# Patient Record
Sex: Male | Born: 1975 | Race: Black or African American | Hispanic: No | Marital: Married | Smoking: Never smoker
Health system: Southern US, Community
[De-identification: ages and names within clinical notes are randomized; demographics above are authoritative.]

---

## 2014-02-12 ENCOUNTER — Emergency Department (HOSPITAL_BASED_OUTPATIENT_CLINIC_OR_DEPARTMENT_OTHER)
Admission: EM | Admit: 2014-02-12 | Discharge: 2014-02-12 | Disposition: A | Discharge status: Home / Self Care | Payer: Managed Care, Other (non HMO) | Attending: Emergency Medicine | Admitting: Emergency Medicine

## 2014-02-12 ENCOUNTER — Emergency Department (HOSPITAL_BASED_OUTPATIENT_CLINIC_OR_DEPARTMENT_OTHER): Payer: Managed Care, Other (non HMO)

## 2014-02-12 ENCOUNTER — Encounter (HOSPITAL_BASED_OUTPATIENT_CLINIC_OR_DEPARTMENT_OTHER): Payer: Self-pay | Admitting: Emergency Medicine

## 2014-02-12 DIAGNOSIS — N289 Disorder of kidney and ureter, unspecified: Secondary | ICD-10-CM | POA: Insufficient documentation

## 2014-02-12 DIAGNOSIS — N200 Calculus of kidney: Secondary | ICD-10-CM | POA: Insufficient documentation

## 2014-02-12 LAB — URINALYSIS, ROUTINE W REFLEX MICROSCOPIC
Bilirubin Urine: NEGATIVE
GLUCOSE, UA: NEGATIVE mg/dL
HGB URINE DIPSTICK: NEGATIVE
Ketones, ur: NEGATIVE mg/dL
Leukocytes, UA: NEGATIVE
Nitrite: NEGATIVE
PH: 5.5 (ref 5.0–8.0)
Protein, ur: NEGATIVE mg/dL
SPECIFIC GRAVITY, URINE: 1.031 — AB (ref 1.005–1.030)
Urobilinogen, UA: 1 mg/dL (ref 0.0–1.0)

## 2014-02-12 LAB — CBC WITH DIFFERENTIAL/PLATELET
Basophils Absolute: 0 10*3/uL (ref 0.0–0.1)
Basophils Relative: 0 % (ref 0–1)
EOS PCT: 6 % — AB (ref 0–5)
Eosinophils Absolute: 0.3 10*3/uL (ref 0.0–0.7)
HEMATOCRIT: 38.5 % — AB (ref 39.0–52.0)
Hemoglobin: 13.7 g/dL (ref 13.0–17.0)
LYMPHS PCT: 59 % — AB (ref 12–46)
Lymphs Abs: 3.5 10*3/uL (ref 0.7–4.0)
MCH: 32.2 pg (ref 26.0–34.0)
MCHC: 35.6 g/dL (ref 30.0–36.0)
MCV: 90.4 fL (ref 78.0–100.0)
MONO ABS: 0.4 10*3/uL (ref 0.1–1.0)
Monocytes Relative: 6 % (ref 3–12)
NEUTROS ABS: 1.8 10*3/uL (ref 1.7–7.7)
Neutrophils Relative %: 29 % — ABNORMAL LOW (ref 43–77)
Platelets: UNDETERMINED 10*3/uL (ref 150–400)
RBC: 4.26 MIL/uL (ref 4.22–5.81)
RDW: 13.2 % (ref 11.5–15.5)
WBC: 6 10*3/uL (ref 4.0–10.5)

## 2014-02-12 LAB — BASIC METABOLIC PANEL
BUN: 20 mg/dL (ref 6–23)
CHLORIDE: 104 meq/L (ref 96–112)
CO2: 24 mEq/L (ref 19–32)
Calcium: 9.6 mg/dL (ref 8.4–10.5)
Creatinine, Ser: 1.8 mg/dL — ABNORMAL HIGH (ref 0.50–1.35)
GFR calc non Af Amer: 46 mL/min — ABNORMAL LOW (ref >90)
GFR, EST AFRICAN AMERICAN: 54 mL/min — AB (ref >90)
Glucose, Bld: 121 mg/dL — ABNORMAL HIGH (ref 70–99)
Potassium: 3.6 mEq/L — ABNORMAL LOW (ref 3.7–5.3)
Sodium: 142 mEq/L (ref 137–147)

## 2014-02-12 MED ORDER — MORPHINE SULFATE 4 MG/ML IJ SOLN
INTRAMUSCULAR | Status: AC
  Filled 2014-02-12: qty 1

## 2014-02-12 MED ORDER — KETOROLAC TROMETHAMINE 30 MG/ML IJ SOLN
INTRAMUSCULAR | Status: AC
  Filled 2014-02-12: qty 1

## 2014-02-12 MED ORDER — KETOROLAC TROMETHAMINE 30 MG/ML IJ SOLN
30.0000 mg | Freq: Once | INTRAMUSCULAR | Status: AC
  Administered 2014-02-12: 30 mg via INTRAVENOUS

## 2014-02-12 MED ORDER — SODIUM CHLORIDE 0.9 % IV BOLUS (SEPSIS)
500.0000 mL | Freq: Once | INTRAVENOUS | Status: AC
  Administered 2014-02-12: 500 mL via INTRAVENOUS

## 2014-02-12 MED ORDER — IBUPROFEN 800 MG PO TABS: 800.0000 mg | ORAL_TABLET | Freq: Three times a day (TID) | ORAL | Status: AC

## 2014-02-12 MED ORDER — ONDANSETRON 8 MG PO TBDP: ORAL_TABLET | ORAL | Status: AC

## 2014-02-12 MED ORDER — MORPHINE SULFATE 4 MG/ML IJ SOLN
4.0000 mg | Freq: Once | INTRAMUSCULAR | Status: AC
  Administered 2014-02-12: 4 mg via INTRAVENOUS

## 2014-02-12 MED ORDER — OXYCODONE-ACETAMINOPHEN 5-325 MG PO TABS: 1.0000 | ORAL_TABLET | Freq: Four times a day (QID) | ORAL | Status: AC | PRN

## 2014-02-12 MED ORDER — ONDANSETRON HCL 4 MG/2ML IJ SOLN
INTRAMUSCULAR | Status: AC
  Filled 2014-02-12: qty 2

## 2014-02-12 MED ORDER — ONDANSETRON HCL 4 MG/2ML IJ SOLN
4.0000 mg | Freq: Once | INTRAMUSCULAR | Status: AC
  Administered 2014-02-12: 4 mg via INTRAVENOUS

## 2014-02-12 MED ORDER — TAMSULOSIN HCL 0.4 MG PO CAPS: 0.4000 mg | ORAL_CAPSULE | Freq: Every day | ORAL | Status: AC

## 2014-02-12 NOTE — Discharge Instructions (Signed)
Dietary Guidelines to Help Prevent Kidney Stones Your risk of kidney stones can be decreased by adjusting the foods you eat. The most important thing you can do is drink enough fluid. You should drink enough fluid to keep your urine clear or pale yellow. The following guidelines provide specific information for the type of kidney stone you have had. GUIDELINES ACCORDING TO TYPE OF KIDNEY STONE Calcium Oxalate Kidney Stones  Reduce the amount of salt you eat. Foods that have a lot of salt cause your body to release excess calcium into your urine. The excess calcium can combine with a substance called oxalate to form kidney stones.  Reduce the amount of animal protein you eat if the amount you eat is excessive. Animal protein causes your body to release excess calcium into your urine. Ask your dietitian how much protein from animal sources you should be eating.  Avoid foods that are high in oxalates. If you take vitamins, they should have less than 500 mg of vitamin C. Your body turns vitamin C into oxalates. You do not need to avoid fruits and vegetables high in vitamin C. Calcium Phosphate Kidney Stones  Reduce the amount of salt you eat to help prevent the release of excess calcium into your urine.  Reduce the amount of animal protein you eat if the amount you eat is excessive. Animal protein causes your body to release excess calcium into your urine. Ask your dietitian how much protein from animal sources you should be eating.  Get enough calcium from food or take a calcium supplement (ask your dietitian for recommendations). Food sources of calcium that do not increase your risk of kidney stones include:  Broccoli.  Dairy products, such as cheese and yogurt.  Pudding. Uric Acid Kidney Stones  Do not have more than 6 oz of animal protein per day. FOOD SOURCES Animal Protein Sources  Meat (all types).  Poultry.  Eggs.  Fish, seafood. Foods High in MirantSalt  Salt seasonings.  Soy  sauce.  Teriyaki sauce.  Cured and processed meats.  Salted crackers and snack foods.  Fast food.  Canned soups and most canned foods. Foods High in Oxalates  Grains:  Amaranth.  Barley.  Grits.  Wheat germ.  Bran.  Buckwheat flour.  All bran cereals.  Pretzels.  Whole wheat bread.  Vegetables:  Beans (wax).  Beets and beet greens.  Collard greens.  Eggplant.  Escarole.  Leeks.  Okra.  Parsley.  Rutabagas.  Spinach.  Swiss chard.  Tomato paste.  Fried potatoes.  Sweet potatoes.  Fruits:  Red currants.  Figs.  Kiwi.  Rhubarb.  Meat and Other Protein Sources:  Beans (dried).  Soy burgers and other soybean products.  Miso.  Nuts (peanuts, almonds, pecans, cashews, hazelnuts).  Nut butters.  Sesame seeds and tahini (paste made of sesame seeds).  Poppy seeds.  Beverages:  Chocolate drink mixes.  Soy milk.  Instant iced tea.  Juices made from high-oxalate fruits or vegetables.  Other:  Carob.  Chocolate.  Fruitcake.  Marmalades. Document Released: 12/09/2010 Document Revised: 08/19/2013 Document Reviewed: 07/11/2013 [facility] Patient Information 2015 [employer]. This information is not intended to replace advice given to you by your health care provider. Make sure you discuss any questions you have with your health care provider.   Follow up with Dr. Leonette MostKalish in 1 week to get your kidney function rechecked.

## 2014-02-12 NOTE — ED Notes (Signed)
RLQ pain that radiates to right testicle, denies fever or urinary sxs

## 2014-02-12 NOTE — ED Provider Notes (Signed)
CSN: [id number]     Arrival date & time 02/12/14  0444 History   First MD Initiated Contact with Patient 02/12/14 0503     Chief Complaint  Patient presents with  . Abdominal Pain     (Consider location/radiation/quality/duration/timing/severity/associated sxs/prior Treatment) Patient is a 38 y.o. male presenting with abdominal pain. The history is provided by the patient.  Abdominal Pain Pain quality: sharp   Pain radiates to:  Scrotum Pain severity:  Severe Onset quality:  Sudden Timing:  Constant Progression:  Waxing and waning Chronicity:  New Context: not previous surgeries and not sick contacts   Relieved by:  Nothing Worsened by:  Nothing tried Ineffective treatments:  None tried Associated symptoms: no anorexia, no dysuria, no hematuria and no vomiting   Risk factors: no recent hospitalization     History reviewed. No pertinent past medical history. History reviewed. No pertinent past surgical history. History reviewed. No pertinent family history. History  Substance Use Topics  . Smoking status: Never Smoker   . Smokeless tobacco: Not on file  . Alcohol Use: No    Review of Systems  Gastrointestinal: Positive for abdominal pain. Negative for vomiting and anorexia.  Genitourinary: Negative for dysuria and hematuria.  All other systems reviewed and are negative.     Allergies  Review of patient's allergies indicates no known allergies.  Home Medications   Prior to Admission medications   Not on File   BP 142/85  Pulse 64  Temp(Src) 97.7 F (36.5 C) (Oral)  Resp 20  Ht 5\' 8"  (1.727 m)  Wt 200 lb (90.719 kg)  BMI 30.42 kg/m2  SpO2 99% Physical Exam  Constitutional: He is oriented to person, place, and time. He appears well-developed and well-nourished. No distress.  Writhing in pain  HENT:  Head: Normocephalic and atraumatic.  Eyes: Conjunctivae are normal. Pupils are equal, round, and reactive to light.  Neck: Normal range of motion. Neck  supple.  Cardiovascular: Normal rate, regular rhythm and intact distal pulses.   Pulmonary/Chest: Effort normal and breath sounds normal. He has no wheezes. He has no rales.  Abdominal: Soft. Bowel sounds are increased. There is no tenderness. There is no rebound and no guarding.  Musculoskeletal: Normal range of motion.  Neurological: He is alert and oriented to person, place, and time.  Skin: Skin is warm and dry.  Psychiatric: He has a normal mood and affect.    ED Course  Procedures (including critical care time) Labs Review Labs Reviewed  URINALYSIS, ROUTINE W REFLEX MICROSCOPIC  CBC WITH DIFFERENTIAL  BASIC METABOLIC PANEL    Imaging Review No results found.   EKG Interpretation None      MDM   Final diagnoses:  None    Patient will need to follow up with Dr. Callie FieldingKalisch for recheck of creatinine recommend increase of water consumption to 1 gallon a day.  Follow up in 1 week with urology, strain all urine.  No cross fit x 1 week, stop all energy supplements.      Jasmine AweApril K Palumbo-Rasch, MD 02/12/14 [phone]

## 2015-01-07 IMAGING — CT CT ABD-PELV W/O CM
2 of 4 series · 16 of 46 positions shown, 18 images · non-contrast
Comparison: None.

CLINICAL DATA: Right flank pain.

EXAM:
CT ABDOMEN AND PELVIS WITHOUT CONTRAST
TECHNIQUE: Multidetector CT imaging of the abdomen and pelvis was performed
following the standard protocol without IV contrast.

[Series 2: renal stone > 200 lbs 5.0 b31f · axial · 0.75mm/px · z∈[-578,-118]mm · 13 of 102 slices shown, 15 images]
[im 5/102  soft-tissue]
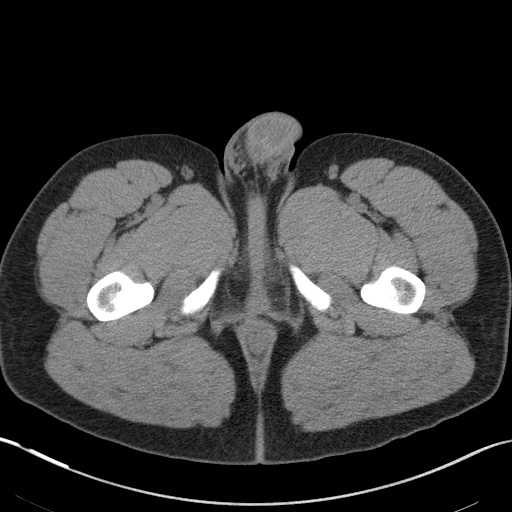
[im 5/102  bone]
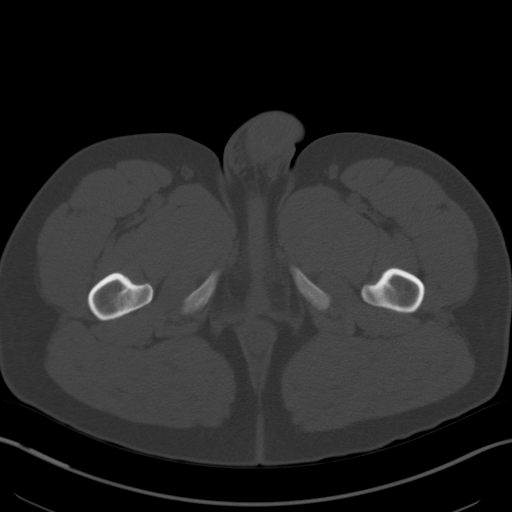
[im 14/102  soft-tissue]
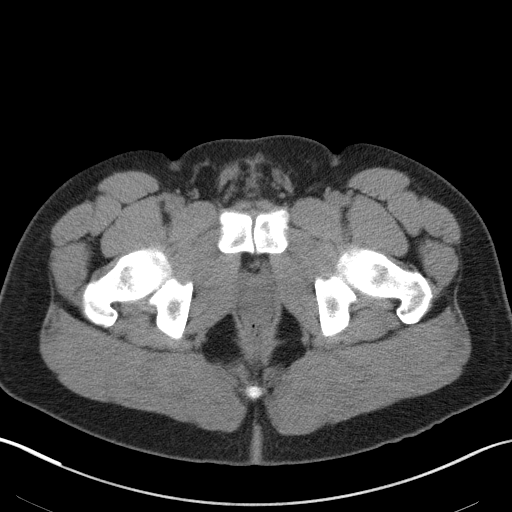
[im 23/102  soft-tissue]
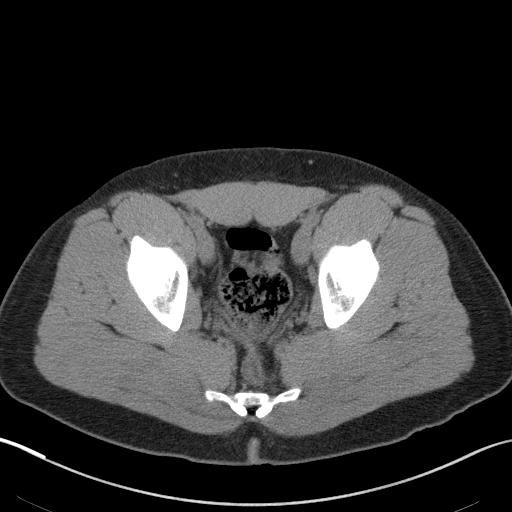
[im 28/102  soft-tissue]
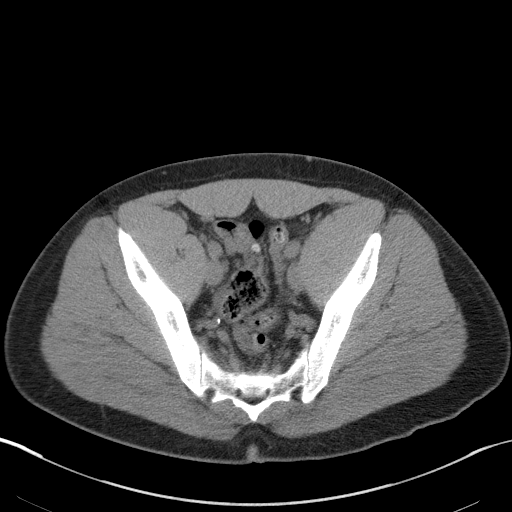
[im 37/102  soft-tissue]
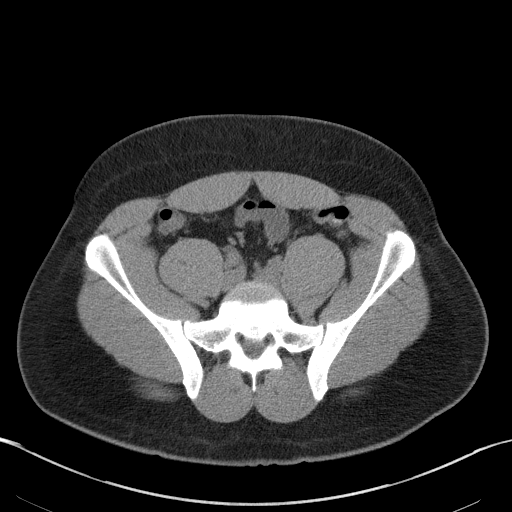
[im 42/102  soft-tissue]
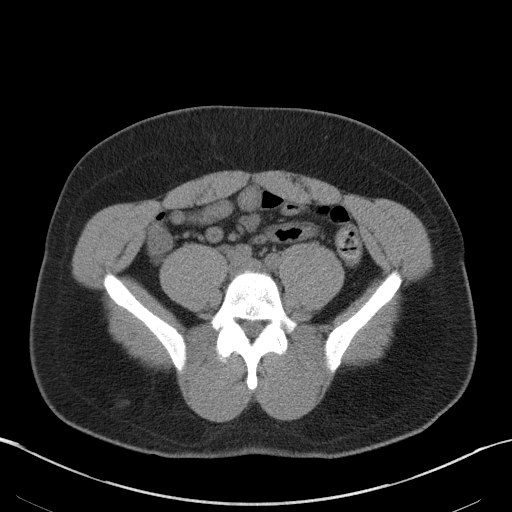
[im 51/102  soft-tissue]
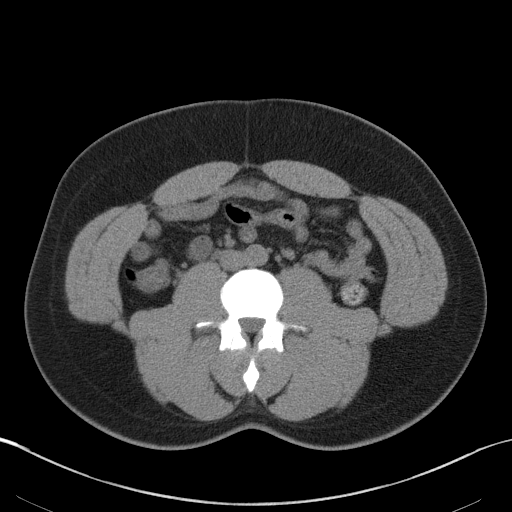
[im 60/102  soft-tissue]
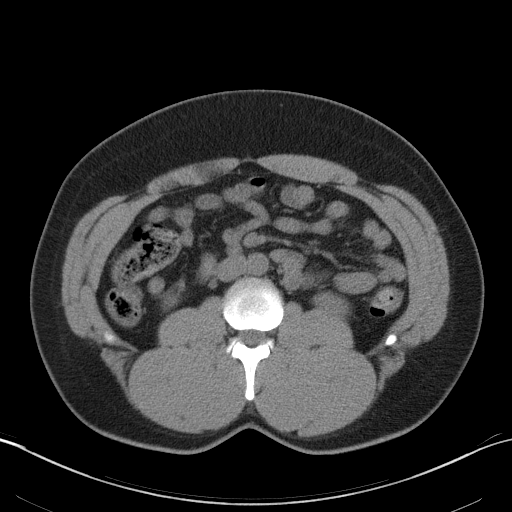
[im 65/102  soft-tissue]
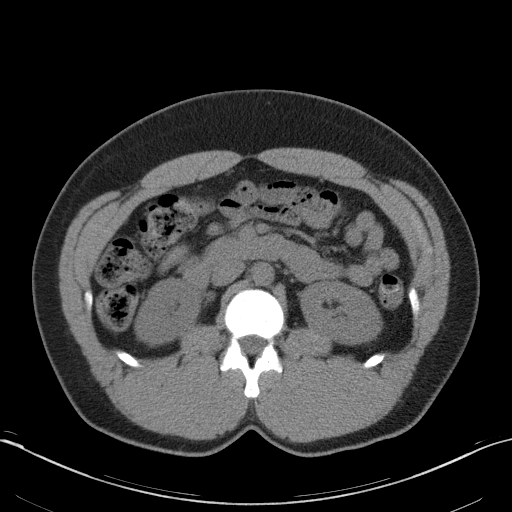
[im 65/102  bone]
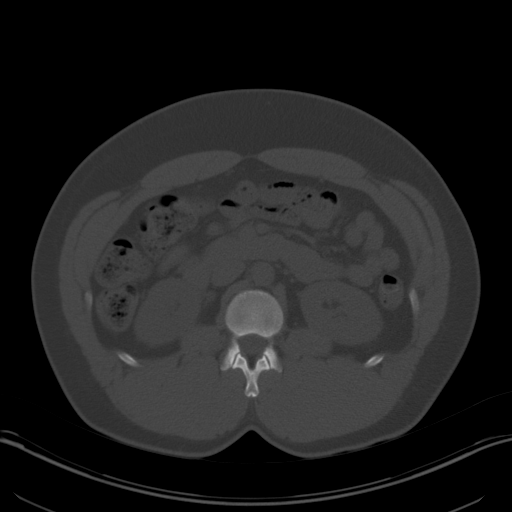
[im 74/102  soft-tissue]
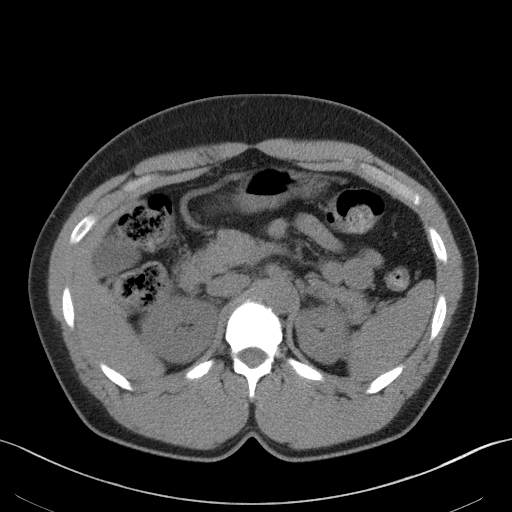
[im 79/102  soft-tissue]
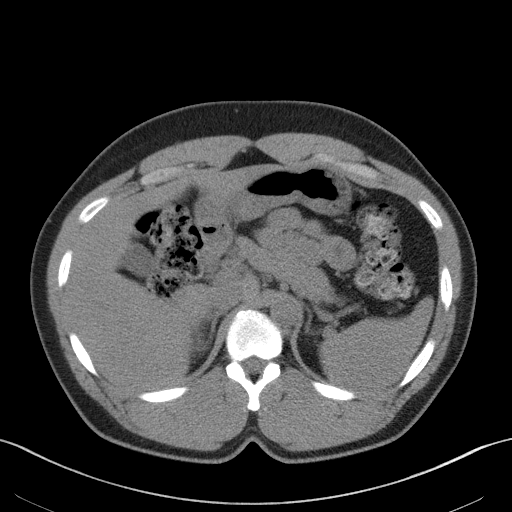
[im 88/102  soft-tissue]
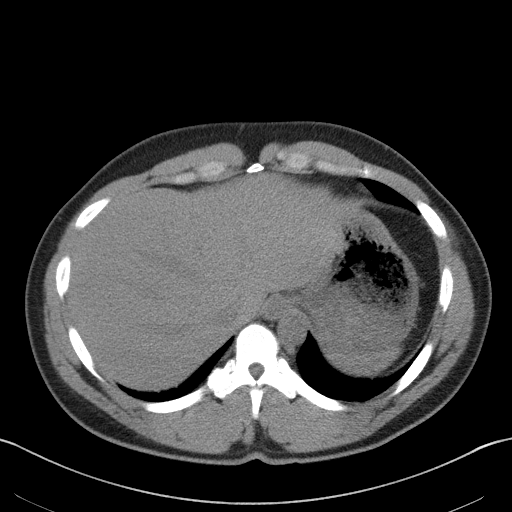
[im 97/102  soft-tissue]
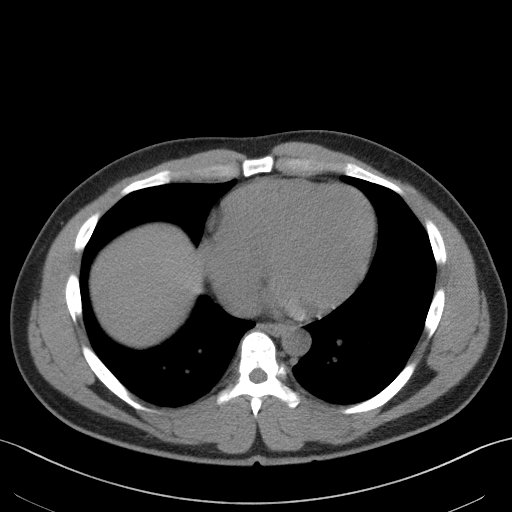

[Series 5: renal stone 3.0 coronal · coronal · 0.84mm/px · 3 of 88 slices shown]
[im 30/88  soft-tissue]
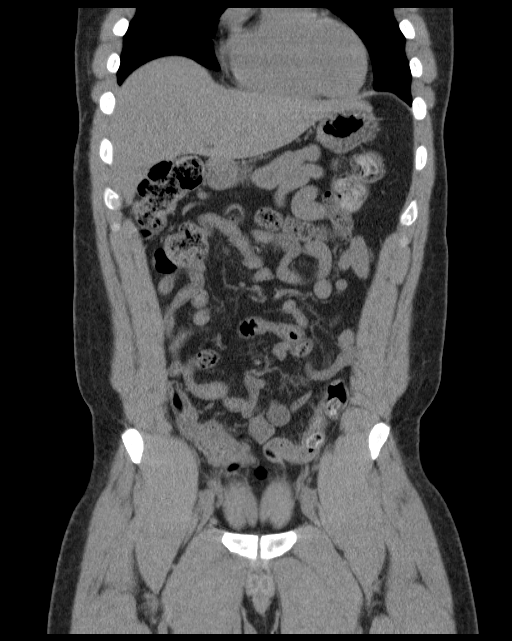
[im 39/88  soft-tissue]
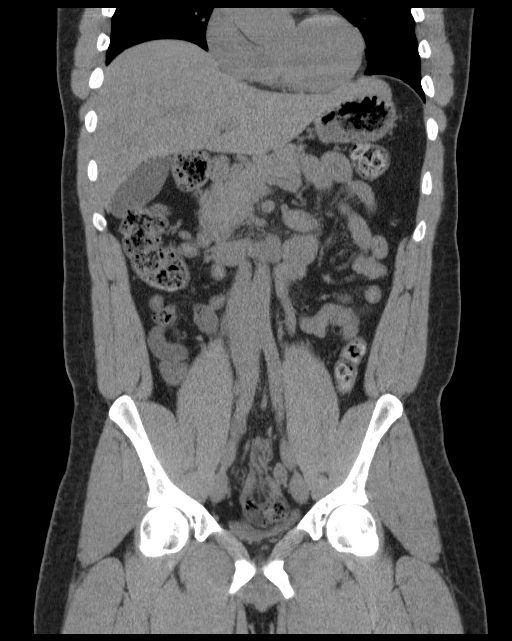
[im 49/88  soft-tissue]
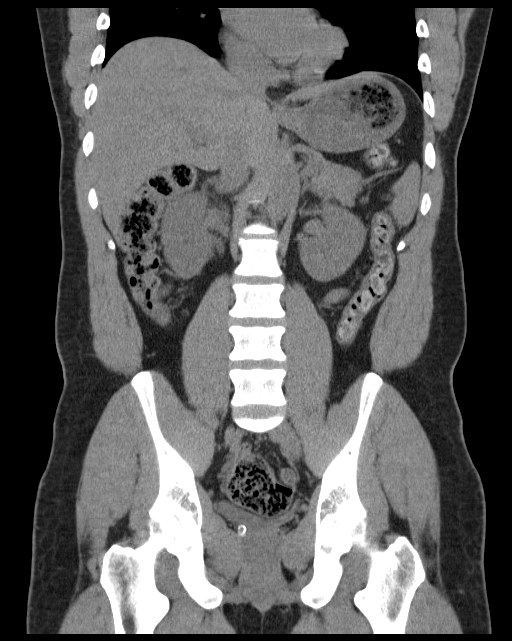

[16 of 46 positions shown; findings below may reference images not displayed]

FINDINGS: The visualized lung bases are clear.

The liver and spleen are unremarkable in appearance. The gallbladder
is within normal limits. The pancreas and adrenal glands are
unremarkable.

There is slight asymmetric right-sided perinephric stranding, and
prominence of the right ureter, with associated inflammation. No
significant hydronephrosis is now seen. A 6 mm stone is noted on the
right side of the base of the bladder. This may reflect a recently
passed stone.

The left kidney is unremarkable in appearance. No nonobstructing
renal stones are identified.

No free fluid is identified. The small bowel is unremarkable in
appearance. The stomach is within normal limits. No acute vascular
abnormalities are seen.

The appendix is normal in caliber, without evidence for
appendicitis. The cecum resides within the mid pelvis. The colon is
unremarkable in appearance.

The bladder is decompressed and not well assessed. The prostate
remains normal in size. No inguinal lymphadenopathy is seen.

No acute osseous abnormalities are identified.
IMPRESSION: Slight asymmetric right-sided perinephric stranding, and right
ureteral prominence and inflammation. No significant hydronephrosis
currently seen. 6 mm stone on the right side of the base of the
bladder may reflect a recently passed stone.
